# Patient Record
Sex: Female | Born: 1940 | Race: White | Hispanic: No
Health system: Southern US, Community
[De-identification: ages and names within clinical notes are randomized; demographics above are authoritative.]

## PROBLEM LIST (undated history)

## (undated) DIAGNOSIS — I252 Old myocardial infarction: Secondary | ICD-10-CM

## (undated) DIAGNOSIS — N289 Disorder of kidney and ureter, unspecified: Secondary | ICD-10-CM

## (undated) DIAGNOSIS — E559 Vitamin D deficiency, unspecified: Secondary | ICD-10-CM

## (undated) DIAGNOSIS — E785 Hyperlipidemia, unspecified: Secondary | ICD-10-CM

## (undated) DIAGNOSIS — I10 Essential (primary) hypertension: Secondary | ICD-10-CM

## (undated) DIAGNOSIS — M1990 Unspecified osteoarthritis, unspecified site: Secondary | ICD-10-CM

## (undated) DIAGNOSIS — K219 Gastro-esophageal reflux disease without esophagitis: Secondary | ICD-10-CM

## (undated) DIAGNOSIS — F0390 Unspecified dementia without behavioral disturbance: Secondary | ICD-10-CM

## (undated) DIAGNOSIS — D649 Anemia, unspecified: Secondary | ICD-10-CM

---

## 2020-12-27 ENCOUNTER — Emergency Department (HOSPITAL_COMMUNITY): Payer: Medicare Other

## 2020-12-27 ENCOUNTER — Other Ambulatory Visit: Payer: Self-pay

## 2020-12-27 ENCOUNTER — Encounter (HOSPITAL_COMMUNITY): Payer: Self-pay | Admitting: Emergency Medicine

## 2020-12-27 ENCOUNTER — Emergency Department (HOSPITAL_COMMUNITY)
Admission: EM | Admit: 2020-12-27 | Discharge: 2020-12-27 | Disposition: A | Discharge status: Home / Self Care | Payer: Medicare Other | Attending: Emergency Medicine | Admitting: Emergency Medicine

## 2020-12-27 DIAGNOSIS — I10 Essential (primary) hypertension: Secondary | ICD-10-CM

## 2020-12-27 DIAGNOSIS — Z79899 Other long term (current) drug therapy: Secondary | ICD-10-CM | POA: Diagnosis not present

## 2020-12-27 DIAGNOSIS — W19XXXA Unspecified fall, initial encounter: Secondary | ICD-10-CM

## 2020-12-27 DIAGNOSIS — Z7982 Long term (current) use of aspirin: Secondary | ICD-10-CM | POA: Insufficient documentation

## 2020-12-27 DIAGNOSIS — F0390 Unspecified dementia without behavioral disturbance: Secondary | ICD-10-CM | POA: Insufficient documentation

## 2020-12-27 HISTORY — DX: Anemia, unspecified: D64.9

## 2020-12-27 HISTORY — DX: Gastro-esophageal reflux disease without esophagitis: K21.9

## 2020-12-27 HISTORY — DX: Hyperlipidemia, unspecified: E78.5

## 2020-12-27 HISTORY — DX: Disorder of kidney and ureter, unspecified: N28.9

## 2020-12-27 HISTORY — DX: Vitamin D deficiency, unspecified: E55.9

## 2020-12-27 HISTORY — DX: Unspecified osteoarthritis, unspecified site: M19.90

## 2020-12-27 HISTORY — DX: Old myocardial infarction: I25.2

## 2020-12-27 HISTORY — DX: Essential (primary) hypertension: I10

## 2020-12-27 HISTORY — DX: Unspecified dementia, unspecified severity, without behavioral disturbance, psychotic disturbance, mood disturbance, and anxiety: F03.90

## 2020-12-27 LAB — CBC WITH DIFFERENTIAL/PLATELET
Abs Immature Granulocytes: 0.02 10*3/uL (ref 0.00–0.07)
Basophils Absolute: 0 10*3/uL (ref 0.0–0.1)
Basophils Relative: 1 %
Eosinophils Absolute: 0.1 10*3/uL (ref 0.0–0.5)
Eosinophils Relative: 1 %
HCT: 35.6 % — ABNORMAL LOW (ref 36.0–46.0)
Hemoglobin: 11.2 g/dL — ABNORMAL LOW (ref 12.0–15.0)
Immature Granulocytes: 0 %
Lymphocytes Relative: 20 %
Lymphs Abs: 1.3 10*3/uL (ref 0.7–4.0)
MCH: 30.5 pg (ref 26.0–34.0)
MCHC: 31.5 g/dL (ref 30.0–36.0)
MCV: 97 fL (ref 80.0–100.0)
Monocytes Absolute: 0.5 10*3/uL (ref 0.1–1.0)
Monocytes Relative: 7 %
Neutro Abs: 4.7 10*3/uL (ref 1.7–7.7)
Neutrophils Relative %: 71 %
Platelets: 187 10*3/uL (ref 150–400)
RBC: 3.67 MIL/uL — ABNORMAL LOW (ref 3.87–5.11)
RDW: 13.4 % (ref 11.5–15.5)
WBC: 6.6 10*3/uL (ref 4.0–10.5)
nRBC: 0 % (ref 0.0–0.2)

## 2020-12-27 LAB — BASIC METABOLIC PANEL
Anion gap: 10 (ref 5–15)
BUN: 12 mg/dL (ref 8–23)
CO2: 25 mmol/L (ref 22–32)
Calcium: 8.6 mg/dL — ABNORMAL LOW (ref 8.9–10.3)
Chloride: 107 mmol/L (ref 98–111)
Creatinine, Ser: 0.9 mg/dL (ref 0.44–1.00)
GFR, Estimated: >60 mL/min (ref >60)
Glucose, Bld: 88 mg/dL (ref 70–99)
Potassium: 3.5 mmol/L (ref 3.5–5.1)
Sodium: 142 mmol/L (ref 135–145)

## 2020-12-27 LAB — TROPONIN I (HIGH SENSITIVITY): Troponin I (High Sensitivity): 9 ng/L (ref <18)

## 2020-12-27 MED ORDER — SODIUM CHLORIDE 0.9 % IV BOLUS
500.0000 mL | Freq: Once | INTRAVENOUS | Status: AC
  Administered 2020-12-27: 500 mL via INTRAVENOUS

## 2020-12-27 MED ORDER — METOPROLOL TARTRATE 25 MG PO TABS
25.0000 mg | ORAL_TABLET | Freq: Once | ORAL | Status: AC
  Administered 2020-12-27: 25 mg via ORAL
  Filled 2020-12-27: qty 1

## 2020-12-27 NOTE — ED Provider Notes (Signed)
[facility] EMERGENCY DEPARTMENT Provider Note   CSN: [id number] Arrival date & time: 12/27/20  0601     History Chief Complaint  Patient presents with  . Fall    Grace Johnson is a 80 y.o. female w/ hx of MI, HTN, HLD, dementia, presenting from nursing facility with unwitnessed fall.  Pt here from [facility] at [phone].  Patient cannot provide any further history, does not recall falling, but states "I fall all the time."  No answer from nursing facility upon attempt to call on her arrival  Per her arrival paperwork, she is FULL CODE.  She takes metoprolol 25 mg XL once daily for HTN, as well as aspirin 81 mg daily, no other forms of blood thinners.     HPI     Past Medical History:  Diagnosis Date  . Anemia   . Arthritis   . Dementia (HCC)   . GERD (gastroesophageal reflux disease)   . Hyperlipidemia   . Hypertension   . Myocardial infarct, old   . Renal disorder    kidney stones  . Vitamin D deficiency     There are no problems to display for this patient.   History reviewed. No pertinent surgical history.   OB History   No obstetric history on file.     No family history on file.  Social History   Tobacco Use  . Smoking status: Unknown If Ever Smoked  . Smokeless tobacco: Never Used  Substance Use Topics  . Alcohol use: Not Currently  . Drug use: Not Currently    Home Medications Prior to Admission medications   Not on File    Allergies    Bee venom, Mushroom extract complex, and Shellfish allergy  Review of Systems   Review of Systems  Unable to perform ROS: Dementia (level 5 caveat)    Physical Exam Updated Vital Signs BP (!) 211/108   Pulse 65   Temp 98.4 F (36.9 C) (Oral)   Resp 17   SpO2 99%   Physical Exam Vitals and nursing note reviewed.  Constitutional:      General: She is not in acute distress.    Appearance: She is well-developed and well-nourished.  HENT:     Head: Normocephalic and atraumatic.   Eyes:     Conjunctiva/sclera: Conjunctivae normal.  Cardiovascular:     Rate and Rhythm: Normal rate and regular rhythm.     Pulses: Normal pulses.  Pulmonary:     Effort: Pulmonary effort is normal. No respiratory distress.     Breath sounds: Normal breath sounds.  Abdominal:     General: There is no distension.     Palpations: Abdomen is soft.     Tenderness: There is no abdominal tenderness.  Musculoskeletal:        General: No swelling, tenderness, deformity, signs of injury or edema. Normal range of motion.     Cervical back: Neck supple.  Skin:    General: Skin is warm and dry.  Neurological:     General: No focal deficit present.     Mental Status: She is alert.     Sensory: No sensory deficit.  Psychiatric:        Mood and Affect: Mood and affect normal.     ED Results / Procedures / Treatments   Labs (all labs ordered are listed, but only abnormal results are displayed) Labs Reviewed  BASIC METABOLIC PANEL - Abnormal; Notable for the following components:  Result Value   Calcium 8.6 (*)    All other components within normal limits  CBC WITH DIFFERENTIAL/PLATELET - Abnormal; Notable for the following components:   RBC 3.67 (*)    Hemoglobin 11.2 (*)    HCT 35.6 (*)    All other components within normal limits  URINALYSIS, ROUTINE W REFLEX MICROSCOPIC  TROPONIN I (HIGH SENSITIVITY)    EKG None  Radiology CT Head Wo Contrast  Result Date: 12/27/2020 CLINICAL DATA:  Minor head trauma.  Unwitnessed fall at nursing home EXAM: CT HEAD WITHOUT CONTRAST TECHNIQUE: Contiguous axial images were obtained from the base of the skull through the vertex without intravenous contrast. COMPARISON:  01/14/2019 FINDINGS: Brain: No evidence of acute infarction, hemorrhage, hydrocephalus, extra-axial collection or mass lesion/mass effect. Confluent chronic small vessel ischemia in the cerebral white matter. Cerebral volume loss which may have mildly progressed from 2020.  Vascular: No hyperdense vessel or unexpected calcification. Skull: Negative for fracture Sinuses/Orbits: No evidence of injury IMPRESSION: No evidence of intracranial injury. Electronically Signed   By: Marnee Spring M.D.   On: 12/27/2020 07:25    Procedures Procedures (including critical care time)  Medications Ordered in ED Medications  metoprolol tartrate (LOPRESSOR) tablet 25 mg (25 mg Oral Given 12/27/20 0742)  sodium chloride 0.9 % bolus 500 mL (500 mLs Intravenous New Bag/Given 12/27/20 0743)    ED Course  I have reviewed the triage vital signs and the nursing notes.  Pertinent labs & imaging results that were available during my care of the patient were reviewed by me and considered in my medical decision making (see chart for details).  80 yo female w/ dementia here with unwitnessed fall from nursing home  DDx includes syncope vs dehydration vs mechanical fall vs UTI vs anemia vs other  With no collateral history available, we'll obtain a basic workup including CTH, trop, BMP, CBC  No signs or symptoms of sepsis on arrival Patient noted to have marked HTN, may be due to agitation from transport - we'll get her morning metoprolol and monitor her BP.  Unclear what her baseline is.    Clinical Course as of 12/27/20 0950  Thu Dec 27, 2020  0729 IMPRESSION: No evidence of intracranial injury [MT]  0850 No answer from [facility] on second attempt to call [MT]  0946 I was able to reach [place] a Therapist, music at [facility] who is familiar with the patient.  She reports that the patient did have an unwitnessed fall earlier this morning, she suspects that this may be due to pain, so do not have the patient's rheumatoid arthritis medication.  The patient has had several falls and typically walks with assistance.  I explained her medical work-up was largely unremarkable, and I will be discharging the patient home.  Discussed the patient's high blood pressure, which is asymptomatic here,  and she says she will get in contact with the patient's NP upon her arrival to discuss new blood pressure medications. [MT]    Clinical Course User Index [MT] Renaye Rakers Kermit Balo, MD     Final Clinical Impression(s) / ED Diagnoses Final diagnoses:  Fall, initial encounter  Hypertension, unspecified type    Rx / DC Orders ED Discharge Orders    None       Riona Lahti, Kermit Balo, MD 12/27/20 [phone]

## 2020-12-27 NOTE — ED Triage Notes (Signed)
Pt had unwitnessed fall at nursing home. Pt denies any pain. Pt has dementia.

## 2020-12-27 NOTE — Discharge Instructions (Addendum)
Grace Johnson's blood tests and CT head were unremarkable.  Please note her blood pressure was high today - about 200/100.  We gave her her 25 mg metoprolol here.  I would advise you contact her PCP upon her return to discuss adding a new medication for BP control.

## 2021-06-26 IMAGING — CT CT HEAD W/O CM
3 series · 16 of 47 positions shown, 19 images · non-contrast
Comparison: 01/14/2019

CLINICAL DATA: Minor head trauma.  Unwitnessed fall at [HOSPITAL]

EXAM:
CT HEAD WITHOUT CONTRAST
TECHNIQUE: Contiguous axial images were obtained from the base of the skull
through the vertex without intravenous contrast.

[Series 2: head w o · axial · 0.43mm/px · z∈[+22,+152]mm · 10 of 32 slices shown, 13 images]
[im 3/32  brain]
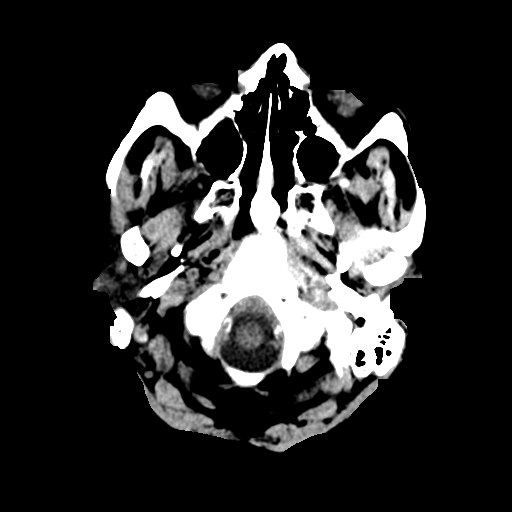
[im 3/32  bone]
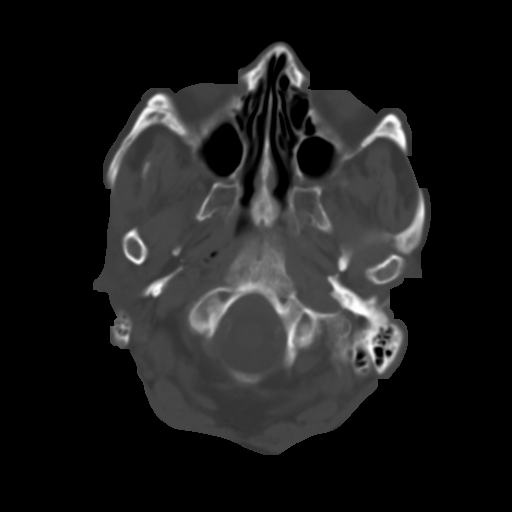
[im 6/32  brain]
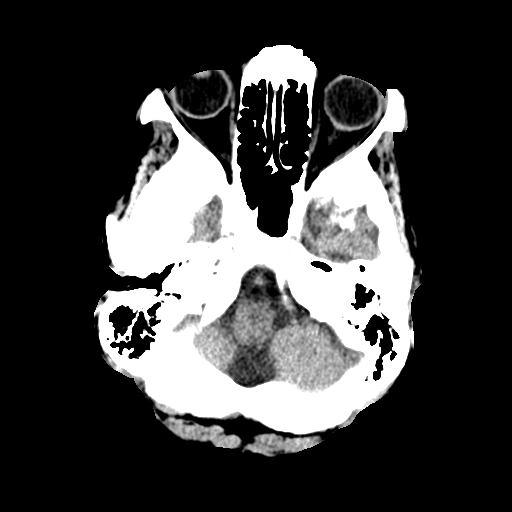
[im 9/32  brain]
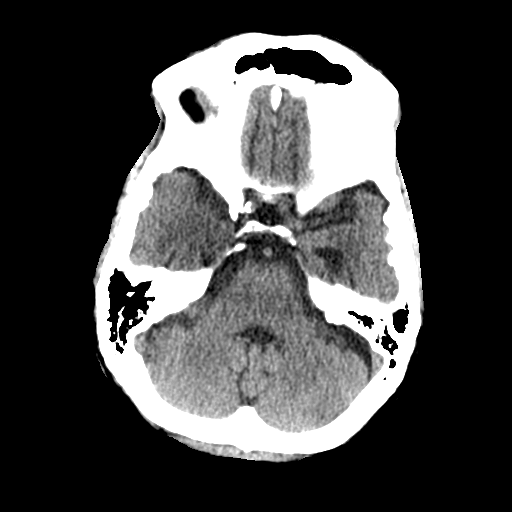
[im 11/32  brain]
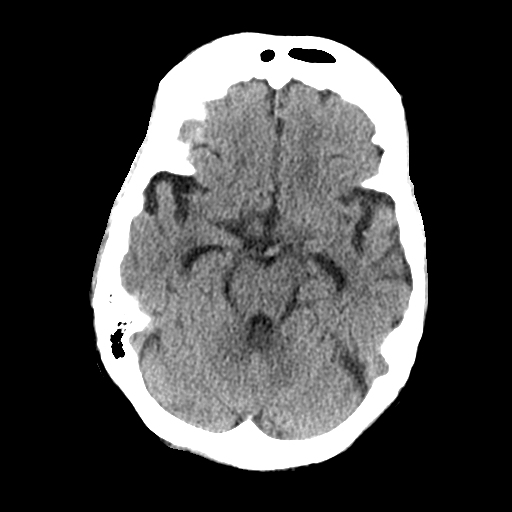
[im 14/32  brain]
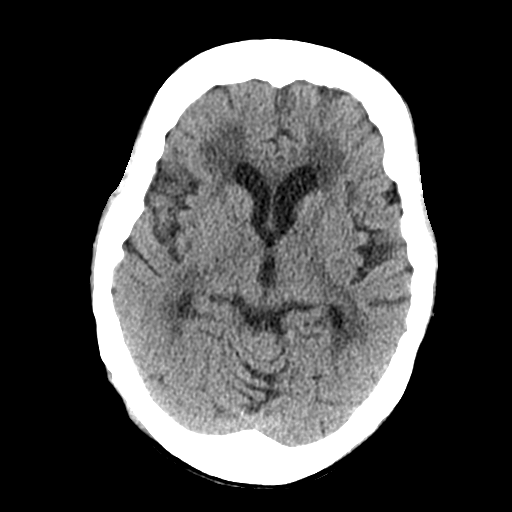
[im 14/32  bone]
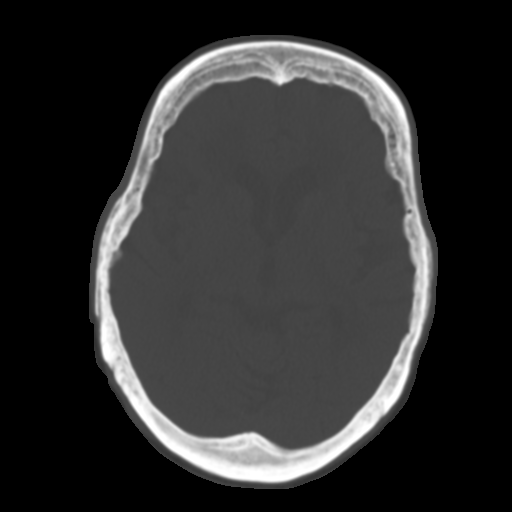
[im 18/32  brain]
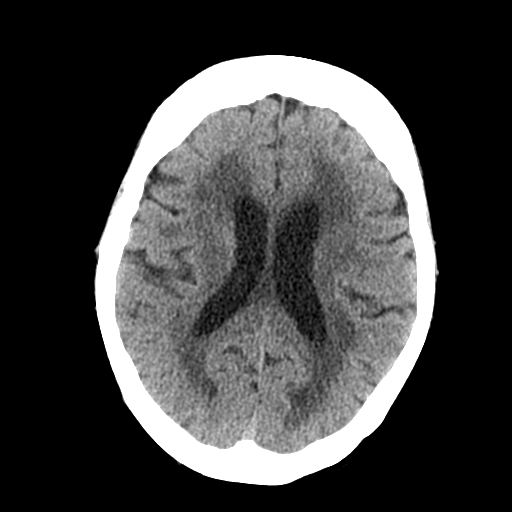
[im 21/32  brain]
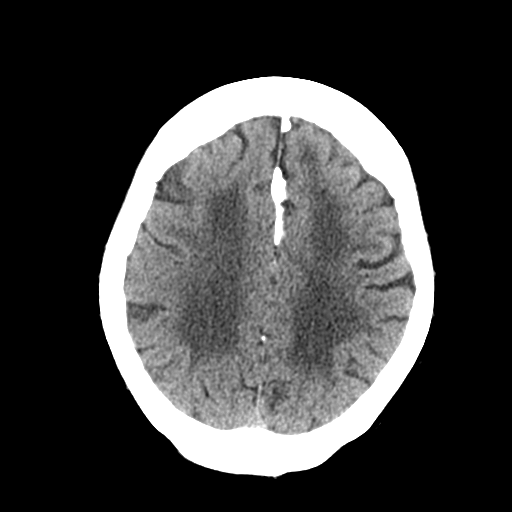
[im 24/32  brain]
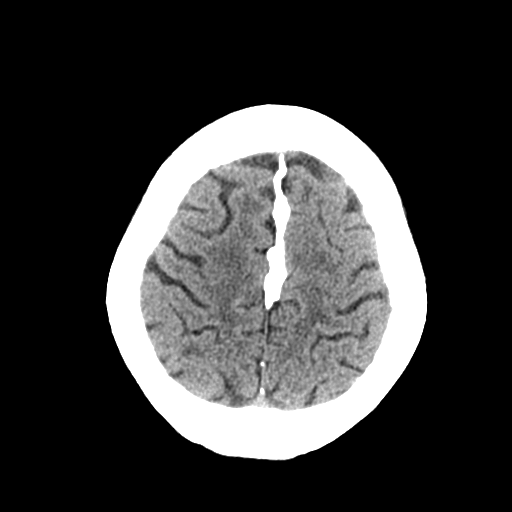
[im 26/32  brain]
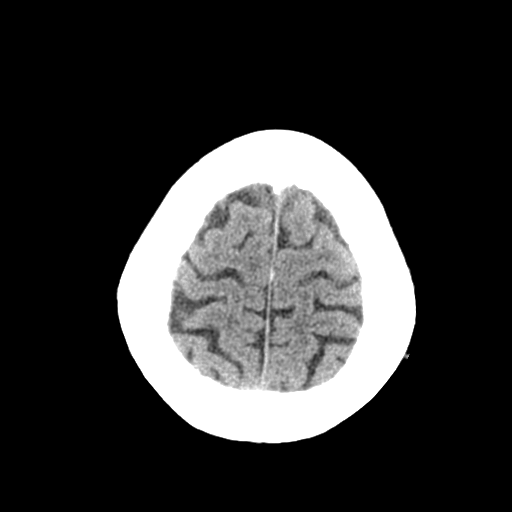
[im 26/32  bone]
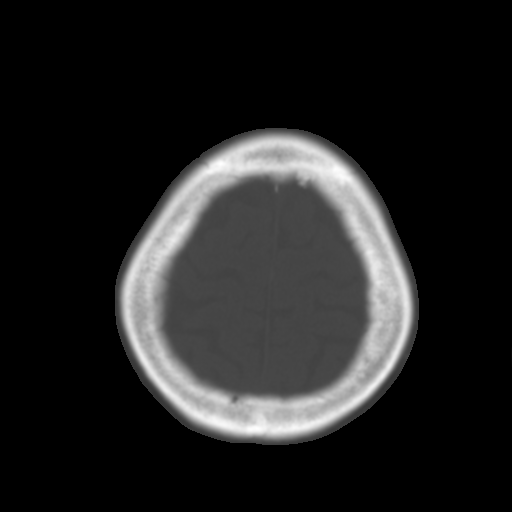
[im 29/32  brain]
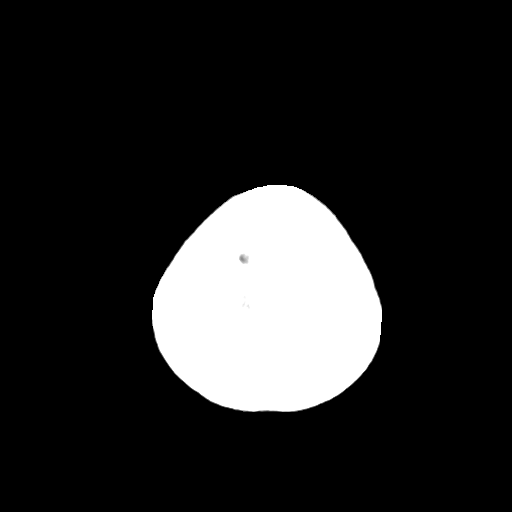

[Series 4: coronal soft · coronal · 0.33mm/px · 3 of 70 slices shown]
[im 24/70  brain]
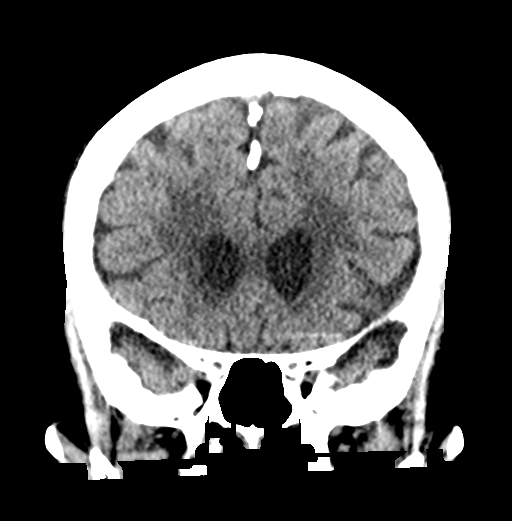
[im 31/70  brain]
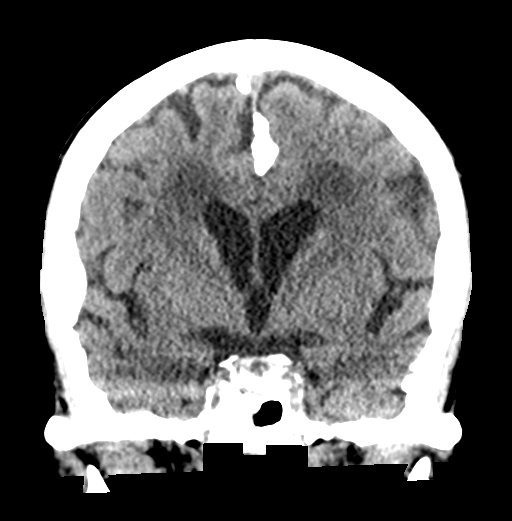
[im 39/70  brain]
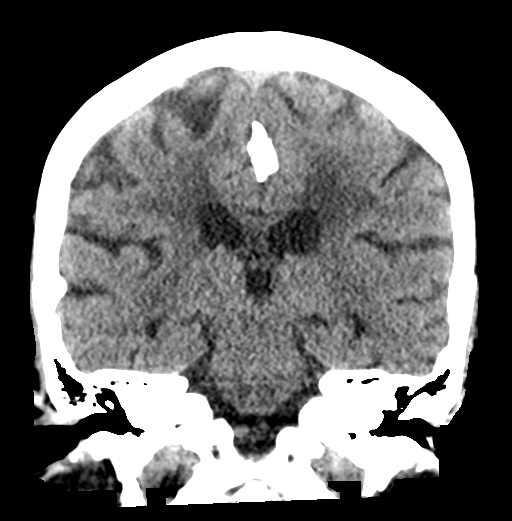

[Series 5: sagittal soft · sagittal · 0.36mm/px · 3 of 55 slices shown]
[im 19/55  brain]
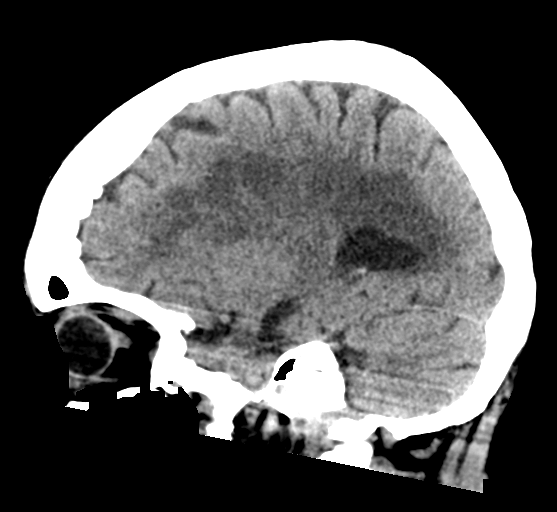
[im 28/55  brain]
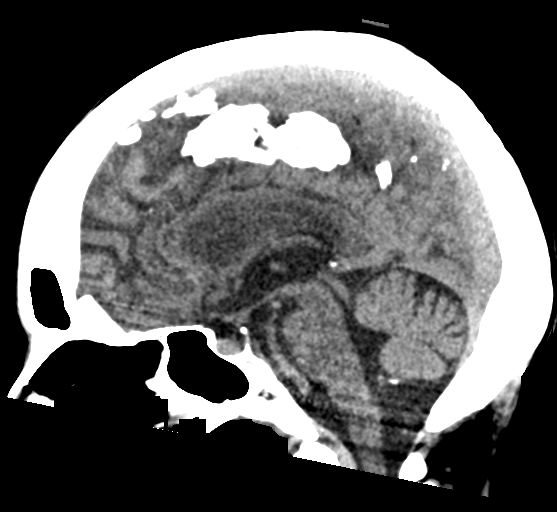
[im 37/55  brain]
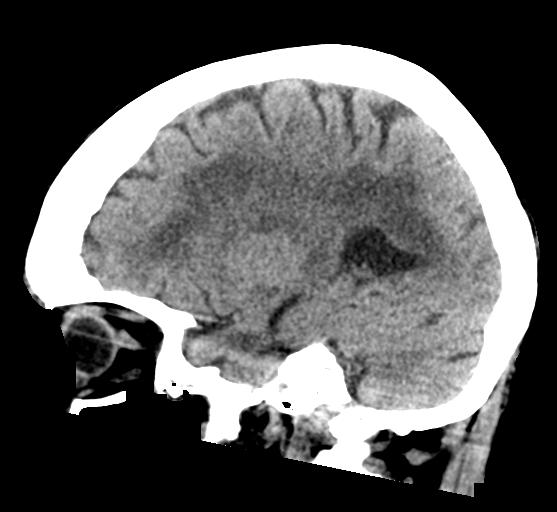

[16 of 47 positions shown; findings below may reference images not displayed]

FINDINGS: Brain: No evidence of acute infarction, hemorrhage, hydrocephalus,
extra-axial collection or mass lesion/mass effect. Confluent chronic
small vessel ischemia in the cerebral white matter. Cerebral volume
loss which may have mildly progressed from 2929.

Vascular: No hyperdense vessel or unexpected calcification.

Skull: Negative for fracture

Sinuses/Orbits: No evidence of injury
IMPRESSION: No evidence of intracranial injury.

## 2021-08-24 ENCOUNTER — Emergency Department (HOSPITAL_COMMUNITY): Payer: Medicare Other

## 2021-08-24 ENCOUNTER — Encounter (HOSPITAL_COMMUNITY): Payer: Self-pay

## 2021-08-24 ENCOUNTER — Emergency Department (HOSPITAL_COMMUNITY)
Admission: EM | Admit: 2021-08-24 | Discharge: 2021-09-21 | Disposition: E | Payer: Medicare Other | Attending: Emergency Medicine | Admitting: Emergency Medicine

## 2021-08-24 ENCOUNTER — Other Ambulatory Visit: Payer: Self-pay

## 2021-08-24 DIAGNOSIS — F0390 Unspecified dementia without behavioral disturbance: Secondary | ICD-10-CM | POA: Insufficient documentation

## 2021-08-24 DIAGNOSIS — J9601 Acute respiratory failure with hypoxia: Secondary | ICD-10-CM | POA: Insufficient documentation

## 2021-08-24 DIAGNOSIS — R7309 Other abnormal glucose: Secondary | ICD-10-CM | POA: Insufficient documentation

## 2021-08-24 DIAGNOSIS — R001 Bradycardia, unspecified: Secondary | ICD-10-CM | POA: Insufficient documentation

## 2021-08-24 DIAGNOSIS — I469 Cardiac arrest, cause unspecified: Secondary | ICD-10-CM | POA: Diagnosis present

## 2021-08-24 DIAGNOSIS — I10 Essential (primary) hypertension: Secondary | ICD-10-CM | POA: Diagnosis not present

## 2021-08-24 LAB — CBG MONITORING, ED: Glucose-Capillary: 68 mg/dL — ABNORMAL LOW (ref 70–99)

## 2021-08-24 MED ORDER — SODIUM CHLORIDE 0.9 % IV SOLN: INTRAVENOUS | Status: DC

## 2021-08-24 MED ORDER — NALOXONE HCL 0.4 MG/ML IJ SOLN
0.4000 mg | Freq: Once | INTRAMUSCULAR | Status: AC
  Administered 2021-08-24: 0.4 mg via INTRAVENOUS
  Filled 2021-08-24: qty 1

## 2021-08-24 MED ORDER — ETOMIDATE 2 MG/ML IV SOLN
INTRAVENOUS | Status: AC
  Filled 2021-08-24: qty 20

## 2021-08-24 MED ORDER — CALCIUM CHLORIDE 10 % IV SOLN
INTRAVENOUS | Status: AC | PRN
  Administered 2021-08-24: 1 g via INTRAVENOUS

## 2021-08-24 MED ORDER — SUCCINYLCHOLINE CHLORIDE 200 MG/10ML IV SOSY
PREFILLED_SYRINGE | INTRAVENOUS | Status: AC
  Filled 2021-08-24: qty 10

## 2021-08-24 MED ORDER — EPINEPHRINE 1 MG/10ML IJ SOSY
PREFILLED_SYRINGE | INTRAMUSCULAR | Status: AC | PRN
  Administered 2021-08-24 (×7): 1 mg via INTRAVENOUS

## 2021-08-24 MED ORDER — SODIUM BICARBONATE 8.4 % IV SOLN
INTRAVENOUS | Status: AC | PRN
  Administered 2021-08-24 (×2): 50 meq via INTRAVENOUS

## 2021-08-24 MED ORDER — DEXTROSE 50 % IV SOLN
INTRAVENOUS | Status: AC | PRN
  Administered 2021-08-24: 1 via INTRAVENOUS

## 2021-08-24 MED ORDER — ATROPINE SULFATE 1 MG/ML IJ SOLN
INTRAMUSCULAR | Status: AC | PRN
Start: 2021-08-24 — End: 2021-08-24
  Administered 2021-08-24 (×2): 1 mg via INTRAVENOUS

## 2021-08-24 MED ORDER — SODIUM CHLORIDE 0.9 % IV BOLUS
1000.0000 mL | Freq: Once | INTRAVENOUS | Status: AC
  Administered 2021-08-24: 1000 mL via INTRAVENOUS

## 2021-08-28 MED FILL — Medication: Qty: 1 | Status: AC

## 2021-09-21 NOTE — ED Triage Notes (Signed)
Pt brought to ED via [facility] for respiratory distress. Staff went to check on patient for bath, seen she was having difficultly breathing, O2 sats in 60's, placed on simple facemask. EMS arrived, placed on 4L. Pt with minimal response.

## 2021-09-21 NOTE — Code Documentation (Signed)
Patient time of death occurred at 53.

## 2021-09-21 NOTE — Code Documentation (Addendum)
Pulse noted at 35. CPR held at this time.

## 2021-09-21 NOTE — ED Provider Notes (Signed)
[facility] EMERGENCY DEPARTMENT Provider Note   CSN: [id number] Arrival date & time: September 06, 2021  1103     History Chief Complaint  Patient presents with   Unresponsive    Grace Johnson is a 80 y.o. female.  Patient arrived from [facility] nursing facility by [facility] completely unresponsive and in respiratory distress.  According to EMS staff went to check on patient for bath seen she was having difficulty breathing O2 sats were in the 60s placed on simple facemask.  EMS arrived placed on 4 L.  They stated that there was minimal response.  Upon arrival here patient was completely unresponsive breathing on her own but appeared to be some respiratory distress with that breathing.  No movement of extremities.  Pupils were about 1 mm nonreactive no corneal reflex.  Patient's paperwork from the nursing home stated that she was a full code.  Patient's heart rate does seem to be very slow in the 30s.  Did have both carotid and femoral pulse.  Has a history of severe dementia.  Hyperlipidemia hypertension coronary artery disease and a history of kidney stones.  Patient's been seen by [place] 1 time before in January following a fall.  EMS had stated the blood pressure was within the 120s.  Patient did not have any IV access.      Past Medical History:  Diagnosis Date   Anemia    Arthritis    Dementia (HCC)    GERD (gastroesophageal reflux disease)    Hyperlipidemia    Hypertension    Myocardial infarct, old    Renal disorder    kidney stones   Vitamin D deficiency     There are no problems to display for this patient.   History reviewed. No pertinent surgical history.   OB History   No obstetric history on file.     No family history on file.  Social History   Tobacco Use   Smoking status: Unknown   Smokeless tobacco: Never  Substance Use Topics   Alcohol use: Not Currently   Drug use: Not Currently    Home Medications Prior to Admission medications   Not on  File    Allergies    Bee venom, Mushroom extract complex, and Shellfish allergy  Review of Systems   Review of Systems  Unable to perform ROS: Patient unresponsive   Physical Exam Updated Vital Signs BP (!) 90/46   Pulse (!) 30   Temp 98.9 F (37.2 C) (Rectal)   Resp 15   Ht 1.575 m (5\' 2" )   Wt 56.3 kg   SpO2 95% Comment: 4L  BMI 22.70 kg/m   Physical Exam Vitals and nursing note reviewed.  Constitutional:      General: She is in acute distress.     Appearance: She is well-developed. She is ill-appearing and toxic-appearing.  HENT:     Head: Normocephalic and atraumatic.     Mouth/Throat:     Mouth: Mucous membranes are dry.  Eyes:     Comments: Pupils 1 mm nonreactive.  No corneal reflex  Cardiovascular:     Rate and Rhythm: Regular rhythm. Bradycardia present.     Heart sounds: No murmur heard. Pulmonary:     Effort: Respiratory distress present.     Breath sounds: Rales present.  Abdominal:     General: There is no distension.     Palpations: Abdomen is soft.  Musculoskeletal:        General: No swelling or deformity.  Cervical back: Neck supple.     Comments: Patient's lower extremities were contracted.  Skin:    General: Skin is warm and dry.  Neurological:     Comments: Patient completely unresponsive.  No movement of extremities.  No corneal reflex.  No blinking of her eyes.    ED Results / Procedures / Treatments   Labs (all labs ordered are listed, but only abnormal results are displayed) Labs Reviewed  CBG MONITORING, ED - Abnormal; Notable for the following components:      Result Value   Glucose-Capillary 68 (*)    All other components within normal limits  RESP PANEL BY RT-PCR (FLU A&B, COVID) [id number]  CBC WITH DIFFERENTIAL/PLATELET  URINALYSIS, ROUTINE W REFLEX MICROSCOPIC    EKG None  Radiology No results found.  Procedures Procedure Name: Intubation Date/Time: September 14, 2021 1:02 PM Performed by: Vanetta Mulders,  MD Pre-anesthesia Checklist: Emergency Drugs available and Suction available Preoxygenation: Pre-oxygenation with 100% oxygen Ventilation: Mask ventilation without difficulty Laryngoscope Size: Glidescope and 4 Tube size: 7.5 mm Number of attempts: 2 Airway Equipment and Method: Rigid stylet Placement Confirmation: ETT inserted through vocal cords under direct vision, CO2 detector and Breath sounds checked- equal and bilateral Secured at: 25 cm Tube secured with: ETT holder Comments: This was an emergent intubation for significant respiratory distress and patient actually went into cardiac arrest as we were preparing to intubate.  And she also vomited a coffee-ground type substance.  Which coded the glottis and epiglottis area.  Making the intubation a little more difficult suctioning was available patient was suction but things remain dark.  Readjusted positioning and was able to intubate her on the second try.  No induction agents were used or needed.      Medications Ordered in ED Medications  0.9 %  sodium chloride infusion (has no administration in time range)  etomidate (AMIDATE) 2 MG/ML injection (has no administration in time range)  naloxone [facility]) injection 0.4 mg (0.4 mg Intravenous Given September 14, 2021 1124)  sodium chloride 0.9 % bolus 1,000 mL (1,000 mLs Intravenous New Bag/Given 09/14/2021 1139)  atropine injection (1 mg Intravenous Given September 14, 2021 1130)  EPINEPHrine (ADRENALIN) 1 MG/10ML injection (1 mg Intravenous Given 09/14/21 1154)  sodium bicarbonate injection (50 mEq Intravenous Given 09/14/2021 1145)  dextrose 50 % solution (1 ampule Intravenous Given 2021-09-14 1143)  calcium chloride injection (1 g Intravenous Given 09/14/2021 1157)    ED Course  I have reviewed the triage vital signs and the nursing notes.  Pertinent labs & imaging results that were available during my care of the patient were reviewed by me and considered in my medical decision making (see chart for details).     MDM Rules/Calculators/A&P                         CRITICAL CARE Performed by: Vanetta Mulders Total critical care time: 60 minutes Critical care time was exclusive of separately billable procedures and treating other patients. Critical care was necessary to treat or prevent imminent or life-threatening deterioration. Critical care was time spent personally by me on the following activities: development of treatment plan with patient and/or surrogate as well as nursing, discussions with consultants, evaluation of patient's response to treatment, examination of patient, obtaining history from patient or surrogate, ordering and performing treatments and interventions, ordering and review of laboratory studies, ordering and review of radiographic studies, pulse oximetry and re-evaluation of patient's condition.  Patient arrived in respiratory distress completely unresponsive with pinpoint  pupils.  Unreactive.  Blood sugar had been checked by EMS they stated that it was in the 100s.  When we repeated it we got 68.  Patient had no IV access.  We did get IV access we tried Narcan to see if that would wake her up the plan was of that did not wake her up we were going to intubate her.  Narcan made no difference whatsoever.  Patient's mucous membranes also are very dry.  We are preparing for intubation when patient went into cardiac arrest.  She did arrive with a pulse of the heart rate was very slow in the 3040s.  After we got the IV we also gave atropine.  She got epinephrine while we were intubating.  No sedating medications were required for the intubation.  The patient did vomit as we were preparing to intubate.  With a coffee-ground type emesis that did make everything dark.  Which required removal some more bagging readjustment good suctioning and then was able to successfully intubate.  Patient then went on to continue to have CPR CPR was started right prior to intubation was paused to do the  intubation.  Patient had multiple rounds of epinephrine multiple rounds of atropine also did attempt bicarb x2 and calcium chloride in case she was hyperkalemic.  We stopped CPR at about 1151 anticipating she was not going to have a rhythm.  But she had her slow rhythm again and had good palpable carotid and right femoral pulse.  Based on that we externally paced her with some capturing initially.  But eventually we lost the capture and could not get it back.  Without that patient's pulses went down.  Patient given epinephrine again patient also head showed no signs of return of life no movement no blinking of her eyes did do some very shallow breathing on her own but not effective or sustainable of life.  Shortly thereafter patient was in asystole.  We stopped CPR there was clear evidence of significant anoxic brain injury or pre-existing brain injury prior to arrival.  And patient was pronounced at 1211.  Cussed with medical examiner Asencion Partridge.  Will not be a medical examiner case.  Contacted her daughter who is actually inpatient at [facility] she was receiving dialysis they could not get a phone to her.  Left my phone number here so that she would be able to call when she got back to her room.  Paperwork from the nursing facility stated that patient was a full code.  Patient arrived in significant respiratory distress.  Completely unresponsive not clear how long she was an extremis.  No IV.  Patient did arrive on just nasal cannula oxygen.  We did quickly switch her over to a nonrebreather.  Patient then got to the point where she was not breathing well on her own.  She did have bradycardia may have been a complete heart block.  And preparation to intubate patient went into full cardiac arrest.  But she did get atropine before hand.  That brought her heart rate up into the 40s.   Final Clinical Impression(s) / ED Diagnoses Final diagnoses:  Acute respiratory failure with hypoxia [facility])   Cardiac arrest [facility])    Rx / DC Orders ED Discharge Orders     None        Vanetta Mulders, MD 2021/09/11 1313

## 2021-09-21 NOTE — ED Notes (Signed)
Pt asystole, Per MD time of death 1210-04-04

## 2021-09-21 NOTE — Code Documentation (Signed)
No HR noted. CPR started.

## 2021-09-21 NOTE — Code Documentation (Signed)
Pt placed on pacer pads with rate of 70, 44mA.

## 2021-09-21 DEATH — deceased
